# Patient Record
Sex: Male | Born: 1980
Health system: Southern US, Community
[De-identification: ages and names within clinical notes are randomized; demographics above are authoritative.]

## PROBLEM LIST (undated history)

## (undated) DIAGNOSIS — G43909 Migraine, unspecified, not intractable, without status migrainosus: Secondary | ICD-10-CM

## (undated) DIAGNOSIS — I10 Essential (primary) hypertension: Secondary | ICD-10-CM

## (undated) HISTORY — PX: VASECTOMY: SHX75

## (undated) HISTORY — DX: Migraine, unspecified, not intractable, without status migrainosus: G43.909

## (undated) HISTORY — PX: TONSILLECTOMY: SUR1361

---

## 2004-09-29 ENCOUNTER — Inpatient Hospital Stay (HOSPITAL_COMMUNITY): Admission: EM | Admit: 2004-09-29 | Discharge: 2004-10-06 | Payer: Self-pay | Admitting: Emergency Medicine

## 2004-11-10 ENCOUNTER — Ambulatory Visit (HOSPITAL_COMMUNITY): Admission: RE | Admit: 2004-11-10 | Discharge: 2004-11-10 | Payer: Self-pay | Admitting: General Surgery
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
## 2018-06-10 ENCOUNTER — Encounter: Payer: Self-pay | Admitting: Family Medicine

## 2018-06-10 ENCOUNTER — Ambulatory Visit (INDEPENDENT_AMBULATORY_CARE_PROVIDER_SITE_OTHER): Payer: BLUE CROSS/BLUE SHIELD | Admitting: Family Medicine

## 2018-06-10 VITALS — BP 128/80 | HR 75 | Ht 76.0 in | Wt 275.0 lb

## 2018-06-10 DIAGNOSIS — Z0000 Encounter for general adult medical examination without abnormal findings: Secondary | ICD-10-CM | POA: Diagnosis not present

## 2018-06-10 DIAGNOSIS — Z23 Encounter for immunization: Secondary | ICD-10-CM

## 2018-06-10 LAB — CBC
HCT: 42.7 % (ref 39.0–52.0)
Hemoglobin: 14.1 g/dL (ref 13.0–17.0)
MCHC: 32.9 g/dL (ref 30.0–36.0)
MCV: 84.2 fl (ref 78.0–100.0)
Platelets: 277 10*3/uL (ref 150.0–400.0)
RBC: 5.07 Mil/uL (ref 4.22–5.81)
RDW: 13.2 % (ref 11.5–15.5)
WBC: 6.5 10*3/uL (ref 4.0–10.5)

## 2018-06-10 LAB — COMPREHENSIVE METABOLIC PANEL
ALBUMIN: 4.4 g/dL (ref 3.5–5.2)
ALT: 33 U/L (ref 0–53)
AST: 23 U/L (ref 0–37)
Alkaline Phosphatase: 51 U/L (ref 39–117)
BUN: 10 mg/dL (ref 6–23)
CHLORIDE: 98 meq/L (ref 96–112)
CO2: 26 meq/L (ref 19–32)
Calcium: 9.6 mg/dL (ref 8.4–10.5)
Creatinine, Ser: 1.09 mg/dL (ref 0.40–1.50)
GFR: 96.9 mL/min (ref >60.00)
GLUCOSE: 84 mg/dL (ref 70–99)
Potassium: 3.6 mEq/L (ref 3.5–5.1)
SODIUM: 134 meq/L — AB (ref 135–145)
Total Bilirubin: 0.4 mg/dL (ref 0.2–1.2)
Total Protein: 8.4 g/dL — ABNORMAL HIGH (ref 6.0–8.3)

## 2018-06-10 LAB — LIPID PANEL
CHOL/HDL RATIO: 5
Cholesterol: 165 mg/dL (ref 0–200)
HDL: 34.4 mg/dL — ABNORMAL LOW (ref >39.00)
LDL CALC: 113 mg/dL — AB (ref 0–99)
NONHDL: 130.85
Triglycerides: 91 mg/dL (ref 0.0–149.0)
VLDL: 18.2 mg/dL (ref 0.0–40.0)

## 2018-06-10 LAB — URINALYSIS, ROUTINE W REFLEX MICROSCOPIC
Bilirubin Urine: NEGATIVE
Hgb urine dipstick: NEGATIVE
KETONES UR: 40 — AB
Leukocytes, UA: NEGATIVE
Nitrite: NEGATIVE
Specific Gravity, Urine: 1.01 (ref 1.000–1.030)
TOTAL PROTEIN, URINE-UPE24: NEGATIVE
URINE GLUCOSE: NEGATIVE
Urobilinogen, UA: 0.2 (ref 0.0–1.0)
pH: 6 (ref 5.0–8.0)

## 2018-06-10 NOTE — Progress Notes (Signed)
Subjective:  Patient ID: Eric Roberson, male    DOB: 1980/08/27  Age: 37 y.o. MRN: [id number]  CC: Establish Care and Annual Exam   HPI Eric Roberson presents for a physical exam.  He is fasting.  He lives with his wife and 6310 and 39-year-old children.  He enjoys good health as far as he knows.  He has lost some 30 pounds on the keto diet.  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  He does not smoke, use illicit drugs and rarely drinks alcohol.  He does not have a regular exercise program.  His father is 2174 and is somewhat overweight but is in good health otherwise.  His mother is 1471 he is overweight and has hypertension and osteoarthritis.  There is a family history of various cancers.  History Eric Roberson has no past medical history on file.   He has no past surgical history on file.   His family history is not on file.He reports that he has never smoked. He has never used smokeless tobacco. He reports that he drinks alcohol. He reports that he does not use drugs.  No outpatient medications prior to visit.   No facility-administered medications prior to visit.     ROS Review of Systems  Constitutional: Negative.   HENT: Negative.   Eyes: Negative for photophobia and visual disturbance.  Respiratory: Negative.   Cardiovascular: Negative.   Gastrointestinal: Negative.   Endocrine: Negative for polyphagia and polyuria.  Genitourinary: Negative for difficulty urinating, frequency and urgency.  Musculoskeletal: Negative for gait problem and joint swelling.  Skin: Negative for pallor and rash.  Allergic/Immunologic: Negative for immunocompromised state.  Neurological: Negative for light-headedness and headaches.  Hematological: Does not bruise/bleed easily.  Psychiatric/Behavioral: Negative.     Objective:  BP 128/80 (BP Location: Right Arm, Patient Position: Sitting, Cuff Size: Normal)   Pulse 75   Ht 6\' 4"  (1.93 m)   Wt 275 lb (124.7 kg)   SpO2 99%   BMI 33.47 kg/m   Physical Exam    Constitutional: He is oriented to person, place, and time. He appears well-developed and well-nourished. No distress.  HENT:  Head: Normocephalic and atraumatic.  Right Ear: External ear normal.  Left Ear: External ear normal.  Mouth/Throat: Oropharynx is clear and moist. No oropharyngeal exudate.  Eyes: Pupils are equal, round, and reactive to light. Conjunctivae and EOM are normal. Right eye exhibits no discharge. Left eye exhibits no discharge. No scleral icterus.  Neck: Neck supple. No JVD present. No tracheal deviation present. No thyromegaly present.  Cardiovascular: Normal rate, regular rhythm and normal heart sounds.  Pulmonary/Chest: Effort normal and breath sounds normal.  Abdominal: Soft. He exhibits no distension. There is no tenderness. There is no guarding. Hernia confirmed negative in the right inguinal area and confirmed negative in the left inguinal area.  Genitourinary: Testes normal and penis normal. Right testis shows no mass, no swelling and no tenderness. Right testis is descended. Left testis shows no mass, no swelling and no tenderness. Left testis is descended. Circumcised. No hypospadias, penile erythema or penile tenderness. No discharge found.  Musculoskeletal: He exhibits no edema.  Lymphadenopathy:    He has no cervical adenopathy. No inguinal adenopathy noted on the right or left side.  Neurological: He is alert and oriented to person, place, and time.  Skin: Skin is warm and dry. He is not diaphoretic.  Psychiatric: He has a normal mood and affect. His behavior is normal.      Assessment & Plan:  Eric Roberson was seen today for establish care and annual exam.  Diagnoses and all orders for this visit:  Healthcare maintenance -     CBC -     Comprehensive metabolic panel -     Lipid panel -     Urinalysis, Routine w reflex microscopic   Eric Roberson does not currently have medications on file.  No orders of the defined types were placed in this  encounter.  Patient is headed to the [place] on Friday.  He will receive his flu vaccine tetanus today.  Made him aware that the keto diet could skew his lipid profile.  Suggested follow-up will pend the results of his blood work today.  Patient was given information on health maintenance and disease prevention including exercise to lose weight.  Follow-up: Return in about 6 months (around 12/09/2018).  Mliss Sax, MD

## 2018-06-10 NOTE — Patient Instructions (Signed)
Health Maintenance, Male A healthy lifestyle and preventive care is important for your health and wellness. Ask your health care provider about what schedule of regular examinations is right for you. What should I know about weight and diet? Eat a Healthy Diet  Eat plenty of vegetables, fruits, whole grains, low-fat dairy products, and lean protein.  Do not eat a lot of foods high in solid fats, added sugars, or salt.  Maintain a Healthy Weight Regular exercise can help you achieve or maintain a healthy weight. You should:  Do at least 150 minutes of exercise each week. The exercise should increase your heart rate and make you sweat (moderate-intensity exercise).  Do strength-training exercises at least twice a week.  Watch Your Levels of Cholesterol and Blood Lipids  Have your blood tested for lipids and cholesterol every 5 years starting at 39 years of age. If you are at high risk for heart disease, you should start having your blood tested when you are 39 years old. You may need to have your cholesterol levels checked more often if: ? Your lipid or cholesterol levels are high. ? You are older than 39 years of age. ? You are at high risk for heart disease.  What should I know about cancer screening? Many types of cancers can be detected early and may often be prevented. Lung Cancer  You should be screened every year for lung cancer if: ? You are a current smoker who has smoked for at least 30 years. ? You are a former smoker who has quit within the past 15 years.  Talk to your health care provider about your screening options, when you should start screening, and how often you should be screened.  Colorectal Cancer  Routine colorectal cancer screening usually begins at 39 years of age and should be repeated every 5-10 years until you are 39 years old. You may need to be screened more often if early forms of precancerous polyps or small growths are found. Your health care provider  may recommend screening at an earlier age if you have risk factors for colon cancer.  Your health care provider may recommend using home test kits to check for hidden blood in the stool.  A small camera at the end of a tube can be used to examine your colon (sigmoidoscopy or colonoscopy). This checks for the earliest forms of colorectal cancer.  Prostate and Testicular Cancer  Depending on your age and overall health, your health care provider may do certain tests to screen for prostate and testicular cancer.  Talk to your health care provider about any symptoms or concerns you have about testicular or prostate cancer.  Skin Cancer  Check your skin from head to toe regularly.  Tell your health care provider about any new moles or changes in moles, especially if: ? There is a change in a mole's size, shape, or color. ? You have a mole that is larger than a pencil eraser.  Always use sunscreen. Apply sunscreen liberally and repeat throughout the day.  Protect yourself by wearing long sleeves, pants, a wide-brimmed hat, and sunglasses when outside.  What should I know about heart disease, diabetes, and high blood pressure?  If you are 18-39 years of age, have your blood pressure checked every 3-5 years. If you are 40 years of age or older, have your blood pressure checked every year. You should have your blood pressure measured twice-once when you are at a hospital or clinic, and once when   you are not at a hospital or clinic. Record the average of the two measurements. To check your blood pressure when you are not at a hospital or clinic, you can use: ? An automated blood pressure machine at a pharmacy. ? A home blood pressure monitor.  Talk to your health care provider about your target blood pressure.  If you are between 45-79 years old, ask your health care provider if you should take aspirin to prevent heart disease.  Have regular diabetes screenings by checking your fasting blood  sugar level. ? If you are at a normal weight and have a low risk for diabetes, have this test once every three years after the age of 45. ? If you are overweight and have a high risk for diabetes, consider being tested at a younger age or more often.  A one-time screening for abdominal aortic aneurysm (AAA) by ultrasound is recommended for men aged 65-75 years who are current or former smokers. What should I know about preventing infection? Hepatitis B If you have a higher risk for hepatitis B, you should be screened for this virus. Talk with your health care provider to find out if you are at risk for hepatitis B infection. Hepatitis C Blood testing is recommended for:  Everyone born from 1945 through 1965.  Anyone with known risk factors for hepatitis C.  Sexually Transmitted Diseases (STDs)  You should be screened each year for STDs including gonorrhea and chlamydia if: ? You are sexually active and are younger than 39 years of age. ? You are older than 39 years of age and your health care provider tells you that you are at risk for this type of infection. ? Your sexual activity has changed since you were last screened and you are at an increased risk for chlamydia or gonorrhea. Ask your health care provider if you are at risk.  Talk with your health care provider about whether you are at high risk of being infected with HIV. Your health care provider may recommend a prescription medicine to help prevent HIV infection.  What else can I do?  Schedule regular health, dental, and eye exams.  Stay current with your vaccines (immunizations).  Do not use any tobacco products, such as cigarettes, chewing tobacco, and e-cigarettes. If you need help quitting, ask your health care provider.  Limit alcohol intake to no more than 2 drinks per day. One drink equals 12 ounces of beer, 5 ounces of wine, or 1 ounces of hard liquor.  Do not use street drugs.  Do not share needles.  Ask your  health care provider for help if you need support or information about quitting drugs.  Tell your health care provider if you often feel depressed.  Tell your health care provider if you have ever been abused or do not feel safe at home. This information is not intended to replace advice given to you by your health care provider. Make sure you discuss any questions you have with your health care provider. Document Released: 01/06/2008 Document Revised: 03/08/2016 Document Reviewed: 04/13/2015 Elsevier Interactive Patient Education  2018 Else[employer] Inc.  Exercising to Lose Weight Exercising can help you to lose weight. In order to lose weight through exercise, you need to do vigorous-intensity exercise. You can tell that you are exercising with vigorous intensity if you are breathing very hard and fast and cannot hold a conversation while exercising. Moderate-intensity exercise helps to maintain your current weight. You can tell that you are exercising   at a moderate level if you have a higher heart rate and faster breathing, but you are still able to hold a conversation. How often should I exercise? Choose an activity that you enjoy and set realistic goals. Your health care provider can help you to make an activity plan that works for you. Exercise regularly as directed by your health care provider. This may include:  Doing resistance training twice each week, such as: ? Push-ups. ? Sit-ups. ? Lifting weights. ? Using resistance bands.  Doing a given intensity of exercise for a given amount of time. Choose from these options: ? 150 minutes of moderate-intensity exercise every week. ? 75 minutes of vigorous-intensity exercise every week. ? A mix of moderate-intensity and vigorous-intensity exercise every week.  Children, pregnant women, people who are out of shape, people who are overweight, and older adults may need to consult a health care provider for individual recommendations. If you have  any sort of medical condition, be sure to consult your health care provider before starting a new exercise program. What are some activities that can help me to lose weight?  Walking at a rate of at least 4.5 miles an hour.  Jogging or running at a rate of 5 miles per hour.  Biking at a rate of at least 10 miles per hour.  Lap swimming.  Roller-skating or in-line skating.  Cross-country skiing.  Vigorous competitive sports, such as football, basketball, and soccer.  Jumping rope.  Aerobic dancing. How can I be more active in my day-to-day activities?  Use the stairs instead of the elevator.  Take a walk during your lunch break.  If you drive, park your car farther away from work or school.  If you take public transportation, get off one stop early and walk the rest of the way.  Make all of your phone calls while standing up and walking around.  Get up, stretch, and walk around every 30 minutes throughout the day. What guidelines should I follow while exercising?  Do not exercise so much that you hurt yourself, feel dizzy, or get very short of breath.  Consult your health care provider prior to starting a new exercise program.  Wear comfortable clothes and shoes with good support.  Drink plenty of water while you exercise to prevent dehydration or heat stroke. Body water is lost during exercise and must be replaced.  Work out until you breathe faster and your heart beats faster. This information is not intended to replace advice given to you by your health care provider. Make sure you discuss any questions you have with your health care provider. Document Released: 08/12/2010 Document Revised: 12/16/2015 Document Reviewed: 12/11/2013 Elsevier Interactive Patient Education  2018 Elsevier Inc.  

## 2018-06-10 NOTE — Addendum Note (Signed)
Addended by: Marcell AngerSELF, Kanyia Heaslip E on: 06/10/2018 03:36 PM   Modules accepted: Orders

## 2018-10-22 ENCOUNTER — Encounter: Payer: Self-pay | Admitting: Family Medicine

## 2018-10-22 ENCOUNTER — Ambulatory Visit (INDEPENDENT_AMBULATORY_CARE_PROVIDER_SITE_OTHER): Payer: BLUE CROSS/BLUE SHIELD | Admitting: Family Medicine

## 2018-10-22 VITALS — Ht 76.0 in

## 2018-10-22 DIAGNOSIS — J301 Allergic rhinitis due to pollen: Secondary | ICD-10-CM | POA: Diagnosis not present

## 2018-10-22 DIAGNOSIS — H6982 Other specified disorders of Eustachian tube, left ear: Secondary | ICD-10-CM

## 2018-10-22 DIAGNOSIS — J019 Acute sinusitis, unspecified: Secondary | ICD-10-CM

## 2018-10-22 MED ORDER — FLUTICASONE PROPIONATE 50 MCG/ACT NA SUSP: 2.0000 | Freq: Every day | NASAL | 6 refills | Status: AC

## 2018-10-22 MED ORDER — CLARITHROMYCIN ER 500 MG PO TB24: 1000.0000 mg | ORAL_TABLET | Freq: Every day | ORAL | 0 refills | Status: AC

## 2018-10-22 NOTE — Progress Notes (Signed)
Established Patient Office Visit  Subjective:  Patient ID: Eric Roberson, male    DOB: 06/10/1981  Age: 38 y.o. MRN: [id number]  CC:  Chief Complaint  Patient presents with  . Sinus Problem    x 4 days, colored mucous when blowing nose, sinus pressure, ear pain mainly in the left, no fever. Have been having allergy problems x 2 weeks with runny nose, sneezing, coughing.    HPI Eric Roberson presents for 38-year-old recent onset of facial pressure teeth pain and yellow rhinorrhea.  There is been no fever or chills nausea or vomiting difficulty breathing.  He is also having discomfort and ear congestion on the left side.  This was preceded by a 2-week history of sneezing itchy watery eyes ears nose and throat with postnasal drip.  He was treating the symptoms with Benadryl.  He was taking aspirin as needed for pain and discomfort.  He has a history of springtime allergies.  Past Medical History:  Diagnosis Date  . Migraine     Past Surgical History:  Procedure Laterality Date  . TONSILLECTOMY    . VASECTOMY      Family History  Problem Relation Age of Onset  . Arthritis Mother   . Hypertension Mother     Social History   Socioeconomic History  . Marital status: Single    Spouse name: Not on file  . Number of children: Not on file  . Years of education: Not on file  . Highest education level: Not on file  Occupational History  . Not on file  Social Needs  . Financial resource strain: Not on file  . Food insecurity:    Worry: Not on file    Inability: Not on file  . Transportation needs:    Medical: Not on file    Non-medical: Not on file  Tobacco Use  . Smoking status: Never Smoker  . Smokeless tobacco: Never Used  Substance and Sexual Activity  . Alcohol use: Yes    Comment: rarely  . Drug use: Never  . Sexual activity: Not on file  Lifestyle  . Physical activity:    Days per week: Not on file    Minutes per session: Not on file  . Stress: Not on  file  Relationships  . Social connections:    Talks on phone: Not on file    Gets together: Not on file    Attends religious service: Not on file    Active member of club or organization: Not on file    Attends meetings of clubs or organizations: Not on file    Relationship status: Not on file  . Intimate partner violence:    Fear of current or ex partner: Not on file    Emotionally abused: Not on file    Physically abused: Not on file    Forced sexual activity: Not on file  Other Topics Concern  . Not on file  Social History Narrative  . Not on file    No outpatient medications prior to visit.   No facility-administered medications prior to visit.     No Known Allergies  ROS Review of Systems  Constitutional: Negative for diaphoresis, fatigue, fever and unexpected weight change.  HENT: Positive for congestion, ear pain, hearing loss, postnasal drip, rhinorrhea, sinus pressure, sinus pain and sneezing. Negative for ear discharge.   Eyes: Negative for photophobia and visual disturbance.  Respiratory: Negative.   Cardiovascular: Negative.   Gastrointestinal: Negative.   Musculoskeletal: Negative for  arthralgias and myalgias.  Neurological: Negative for headaches.  Hematological: Negative.   Psychiatric/Behavioral: Negative.       Objective:    Physical Exam  Constitutional: He is oriented to person, place, and time. He appears well-developed and well-nourished. No distress.  HENT:  Head: Normocephalic and atraumatic.  Right Ear: External ear normal.  Left Ear: External ear normal.  Eyes: Right eye exhibits no discharge. Left eye exhibits no discharge. No scleral icterus.  Neck: No JVD present. No tracheal deviation present.  Pulmonary/Chest: Effort normal. No stridor.  Neurological: He is alert and oriented to person, place, and time.  Skin: He is not diaphoretic.  Psychiatric: He has a normal mood and affect. His behavior is normal.    Ht  (1.93 m)   BMI  33.47 kg/m  Wt Readings from Last 3 Encounters:  06/10/18 275 lb (124.7 kg)     There are no preventive care reminders to display for this patient.  There are no preventive care reminders to display for this patient.  No results found for: TSH Lab Results  Component Value Date   WBC 6.5 06/10/2018   HGB 14.1 06/10/2018   HCT 42.7 06/10/2018   MCV 84.2 06/10/2018   PLT 277.0 06/10/2018   Lab Results  Component Value Date   NA 134 (L) 06/10/2018   K 3.6 06/10/2018   CO2 26 06/10/2018   GLUCOSE 84 06/10/2018   BUN 10 06/10/2018   CREATININE 1.09 06/10/2018   BILITOT 0.4 06/10/2018   ALKPHOS 51 06/10/2018   AST 23 06/10/2018   ALT 33 06/10/2018   PROT 8.4 (H) 06/10/2018   ALBUMIN 4.4 06/10/2018   CALCIUM 9.6 06/10/2018   GFR 96.90 06/10/2018   Lab Results  Component Value Date   CHOL 165 06/10/2018   Lab Results  Component Value Date   HDL 34.40 (L) 06/10/2018   Lab Results  Component Value Date   LDLCALC 113 (H) 06/10/2018   Lab Results  Component Value Date   TRIG 91.0 06/10/2018   Lab Results  Component Value Date   CHOLHDL 5 06/10/2018   No results found for: HGBA1C    Assessment & Plan:   Problem List Items Addressed This Visit      Respiratory   Acute sinusitis - Primary   Relevant Medications   clarithromycin (BIAXIN XL) 500 MG 24 hr tablet   fluticasone (FLONASE) 50 MCG/ACT nasal spray   Seasonal allergic rhinitis due to pollen   Relevant Medications   fluticasone (FLONASE) 50 MCG/ACT nasal spray     Nervous and Auditory   ETD (Eustachian tube dysfunction), left   Relevant Medications   clarithromycin (BIAXIN XL) 500 MG 24 hr tablet   fluticasone (FLONASE) 50 MCG/ACT nasal spray      Meds ordered this encounter  Medications  . clarithromycin (BIAXIN XL) 500 MG 24 hr tablet    Sig: Take 2 tablets (1,000 mg total) by mouth daily for 10 days.    Dispense:  20 tablet    Refill:  0  . fluticasone (FLONASE) 50 MCG/ACT nasal spray     Sig: Place 2 sprays into both nostrils daily.    Dispense:  16 g    Refill:  6    Follow-up: Return in about 10 days (around 11/01/2018), or if symptoms worsen or fail to improve.    Mliss Sax, MDVirtual Visit via Video Note  I connected with Eric Roberson on 10/22/18 at  9:30 AM EDT by  a video enabled telemedicine application and verified that I am speaking with the correct person using two identifiers.   I discussed the limitations of evaluation and management by telemedicine and the availability of in person appointments. The patient expressed understanding and agreed to proceed.  History of Present Illness:    Observations/Objective:   Assessment and Plan:   Follow Up Instructions:    I discussed the assessment and treatment plan with the patient. The patient was provided an opportunity to ask questions and all were answered. The patient agreed with the plan and demonstrated an understanding of the instructions.   The patient was advised to call back or seek an in-person evaluation if the symptoms worsen or if the condition fails to improve as anticipated.  I provided 20 minutes of non-face-to-face time during this encounter.   Patient will use the Biaxin to treat his sinus infection and also take advantage of its anti-inflammatory properties to help with the infection and his apparent eustachian tube dysfunction.  Encouraged him to use the Flonase for at least 10 days to 2 weeks to receive its full benefit.  Follow-up with me in 10 days if not improving.

## 2019-09-18 ENCOUNTER — Other Ambulatory Visit: Payer: Self-pay

## 2019-09-18 ENCOUNTER — Ambulatory Visit: Payer: BC Managed Care – PPO | Attending: Internal Medicine

## 2019-09-18 DIAGNOSIS — Z23 Encounter for immunization: Secondary | ICD-10-CM | POA: Insufficient documentation

## 2019-09-18 NOTE — Progress Notes (Signed)
   Covid-19 Vaccination Clinic  Name:  Momen Ham    MRN: [id number] DOB: [birth year]  09/18/2019  Mr. Stavola was observed post Covid-19 immunization for 15 minutes without incidence. He was provided with Vaccine Information Sheet and instruction to access the V-Safe system.   Mr. Urbas was instructed to call 911 with any severe reactions post vaccine: Marland Kitchen Difficulty breathing  . Swelling of your face and throat  . A fast heartbeat  . A bad rash all over your body  . Dizziness and weakness    Immunizations Administered    Name Date Dose VIS Date Route   Moderna COVID-19 Vaccine 09/18/2019 11:10 AM 0.5 mL 06/24/2019 Intramuscular   Manufacturer: Moderna   Lot: [id number]   NDC: 53646-803-21

## 2019-10-21 ENCOUNTER — Ambulatory Visit: Payer: BC Managed Care – PPO | Attending: Family

## 2019-10-21 DIAGNOSIS — Z23 Encounter for immunization: Secondary | ICD-10-CM

## 2019-10-21 NOTE — Progress Notes (Signed)
   Covid-19 Vaccination Clinic  Name:  Eric Roberson    MRN: [id number] DOB: 10/11/1980  10/21/2019  [name] was observed post Covid-19 immunization for 15 minutes without incident. He was provided with Vaccine Information Sheet and instruction to access the V-Safe system.   [name] was instructed to call 911 with any severe reactions post vaccine: Marland Kitchen Difficulty breathing  . Swelling of face and throat  . A fast heartbeat  . A bad rash all over body  . Dizziness and weakness   Immunizations Administered    Name Date Dose VIS Date Route   Moderna COVID-19 Vaccine 10/21/2019 11:18 AM 0.5 mL 06/24/2019 Intramuscular   Manufacturer: Moderna   Lot: [id number]   NDC: 86773-736-68

## 2020-04-21 ENCOUNTER — Other Ambulatory Visit: Payer: Self-pay | Admitting: Family Medicine

## 2020-04-21 DIAGNOSIS — J301 Allergic rhinitis due to pollen: Secondary | ICD-10-CM

## 2020-04-21 DIAGNOSIS — H6982 Other specified disorders of Eustachian tube, left ear: Secondary | ICD-10-CM

## 2020-07-16 DIAGNOSIS — Z03818 Encounter for observation for suspected exposure to other biological agents ruled out: Secondary | ICD-10-CM | POA: Diagnosis not present

## 2024-06-10 ENCOUNTER — Emergency Department (HOSPITAL_BASED_OUTPATIENT_CLINIC_OR_DEPARTMENT_OTHER)

## 2024-06-10 ENCOUNTER — Encounter (HOSPITAL_BASED_OUTPATIENT_CLINIC_OR_DEPARTMENT_OTHER): Payer: Self-pay

## 2024-06-10 ENCOUNTER — Other Ambulatory Visit: Payer: Self-pay

## 2024-06-10 ENCOUNTER — Emergency Department (HOSPITAL_BASED_OUTPATIENT_CLINIC_OR_DEPARTMENT_OTHER)
Admission: EM | Admit: 2024-06-10 | Discharge: 2024-06-10 | Disposition: A | Discharge status: Home / Self Care | Attending: Emergency Medicine | Admitting: Emergency Medicine

## 2024-06-10 DIAGNOSIS — R600 Localized edema: Secondary | ICD-10-CM | POA: Insufficient documentation

## 2024-06-10 DIAGNOSIS — R7989 Other specified abnormal findings of blood chemistry: Secondary | ICD-10-CM | POA: Diagnosis not present

## 2024-06-10 DIAGNOSIS — R233 Spontaneous ecchymoses: Secondary | ICD-10-CM | POA: Diagnosis not present

## 2024-06-10 HISTORY — DX: Essential (primary) hypertension: I10

## 2024-06-10 LAB — BASIC METABOLIC PANEL WITH GFR
Anion gap: 10 (ref 5–15)
BUN: 14 mg/dL (ref 6–20)
CO2: 26 mmol/L (ref 22–32)
Calcium: 9.3 mg/dL (ref 8.9–10.3)
Chloride: 102 mmol/L (ref 98–111)
Creatinine, Ser: 1.32 mg/dL — ABNORMAL HIGH (ref 0.61–1.24)
GFR, Estimated: >60 mL/min (ref >60)
Glucose, Bld: 96 mg/dL (ref 70–99)
Potassium: 3.9 mmol/L (ref 3.5–5.1)
Sodium: 137 mmol/L (ref 135–145)

## 2024-06-10 LAB — CBC WITH DIFFERENTIAL/PLATELET
Abs Immature Granulocytes: 0.04 K/uL (ref 0.00–0.07)
Basophils Absolute: 0.1 K/uL (ref 0.0–0.1)
Basophils Relative: 1 %
Eosinophils Absolute: 0.1 K/uL (ref 0.0–0.5)
Eosinophils Relative: 1 %
HCT: 42 % (ref 39.0–52.0)
Hemoglobin: 14 g/dL (ref 13.0–17.0)
Immature Granulocytes: 1 %
Lymphocytes Relative: 28 %
Lymphs Abs: 1.7 K/uL (ref 0.7–4.0)
MCH: 28 pg (ref 26.0–34.0)
MCHC: 33.3 g/dL (ref 30.0–36.0)
MCV: 84 fL (ref 80.0–100.0)
Monocytes Absolute: 0.6 K/uL (ref 0.1–1.0)
Monocytes Relative: 10 %
Neutro Abs: 3.6 K/uL (ref 1.7–7.7)
Neutrophils Relative %: 59 %
Platelets: 294 K/uL (ref 150–400)
RBC: 5 MIL/uL (ref 4.22–5.81)
RDW: 13.1 % (ref 11.5–15.5)
WBC: 6 K/uL (ref 4.0–10.5)
nRBC: 0 % (ref 0.0–0.2)

## 2024-06-10 LAB — PROTIME-INR
INR: 1 (ref 0.8–1.2)
Prothrombin Time: 14 s (ref 11.4–15.2)

## 2024-06-10 NOTE — Discharge Instructions (Signed)
 Today you were seen for leg swelling.  I suspect this is likely due to something called peripheral edema.  Please wear compression stockings as needed and elevate your extremities when possible.  Thank you for letting us  treat you today. After reviewing your labs and imaging, I feel you are safe to go home. Please follow up with your PCP in the next several days and provide them with your records from this visit. Return to the Emergency Room if pain becomes severe or symptoms worsen.

## 2024-06-10 NOTE — ED Provider Notes (Signed)
 [facility] Provider Note   CSN: [id number] Arrival date & time: 06/10/24  1518     Patient presents with: Leg Swelling   Eric Roberson is a 43 y.o. male presents today from urgent care for concern for DVT.  Patient has bilateral lower extremity edema and petechiae since yesterday.  Patient reports flying from [facility] yesterday.  Patient denies chest pain, shortness of breath, nausea, vomiting, numbness, weakness, complaints at this time.  Patient denies any history of blood clot.   HPI     Prior to Admission medications   Medication Sig Start Date End Date Taking? Authorizing Provider  fluticasone  (FLONASE ) 50 MCG/ACT nasal spray Place 2 sprays into both nostrils daily. 10/22/18   Berneta Elsie Sayre, MD    Allergies: Patient has no known allergies.    Review of Systems  Cardiovascular:  Positive for leg swelling.    Updated Vital Signs BP (!) 138/98 (BP Location: Right Arm)   Pulse 86   Temp 97.7 F (36.5 C)   Resp 18   SpO2 98%   Physical Exam Vitals and nursing note reviewed.  Constitutional:      General: He is not in acute distress.    Appearance: He is well-developed. He is not toxic-appearing.  HENT:     Head: Normocephalic and atraumatic.  Eyes:     Conjunctiva/sclera: Conjunctivae normal.  Cardiovascular:     Rate and Rhythm: Normal rate and regular rhythm.     Pulses: Normal pulses.     Heart sounds: Normal heart sounds. No murmur heard. Pulmonary:     Effort: Pulmonary effort is normal. No respiratory distress.     Breath sounds: Normal breath sounds.  Abdominal:     Palpations: Abdomen is soft.     Tenderness: There is no abdominal tenderness.  Musculoskeletal:        General: No swelling.     Cervical back: Neck supple.     Right lower leg: Edema present.     Left lower leg: Edema present.     Comments: +1 pitting edema of bilateral feet, patient is neurovascularly intact with 2 dorsalis pedis  pulses bilaterally.  Skin:    General: Skin is warm and dry.     Capillary Refill: Capillary refill takes less than 2 seconds.  Neurological:     General: No focal deficit present.     Mental Status: He is alert and oriented to person, place, and time.  Psychiatric:        Mood and Affect: Mood normal.     (all labs ordered are listed, but only abnormal results are displayed) Labs Reviewed  BASIC METABOLIC PANEL WITH GFR - Abnormal; Notable for the following components:      Result Value   Creatinine, Ser 1.32 (*)    All other components within normal limits  CBC WITH DIFFERENTIAL/PLATELET  PROTIME-INR    EKG: None  Radiology: US  Venous Img Lower Bilateral (DVT) Result Date: 06/10/2024 CLINICAL DATA:  Bilateral lower extremity edema. EXAM: BILATERAL LOWER EXTREMITY VENOUS DOPPLER ULTRASOUND TECHNIQUE: Gray-scale sonography with graded compression, as well as color Doppler and duplex ultrasound were performed to evaluate the lower extremity deep venous systems from the level of the common femoral vein and including the common femoral, femoral, profunda femoral, popliteal and calf veins including the posterior tibial, peroneal and gastrocnemius veins when visible. The superficial great saphenous vein was also interrogated. Spectral Doppler was utilized to evaluate flow at rest and  with distal augmentation maneuvers in the common femoral, femoral and popliteal veins. COMPARISON:  None Available. FINDINGS: RIGHT LOWER EXTREMITY Common Femoral Vein: No evidence of thrombus. Normal compressibility, respiratory phasicity and response to augmentation. Saphenofemoral Junction: No evidence of thrombus. Normal compressibility and flow on color Doppler imaging. Profunda Femoral Vein: No evidence of thrombus. Normal compressibility and flow on color Doppler imaging. Femoral Vein: No evidence of thrombus. Normal compressibility, respiratory phasicity and response to augmentation. Popliteal Vein: No  evidence of thrombus. Normal compressibility, respiratory phasicity and response to augmentation. Calf Veins: No evidence of thrombus. Normal compressibility and flow on color Doppler imaging. Superficial Great Saphenous Vein: No evidence of thrombus. Normal compressibility. Venous Reflux:  None. Other Findings: No evidence of superficial thrombophlebitis or abnormal fluid collection. LEFT LOWER EXTREMITY Common Femoral Vein: No evidence of thrombus. Normal compressibility, respiratory phasicity and response to augmentation. Saphenofemoral Junction: No evidence of thrombus. Normal compressibility and flow on color Doppler imaging. Profunda Femoral Vein: No evidence of thrombus. Normal compressibility and flow on color Doppler imaging. Femoral Vein: No evidence of thrombus. Normal compressibility, respiratory phasicity and response to augmentation. Popliteal Vein: No evidence of thrombus. Normal compressibility, respiratory phasicity and response to augmentation. Calf Veins: No evidence of thrombus. Normal compressibility and flow on color Doppler imaging. Superficial Great Saphenous Vein: No evidence of thrombus. Normal compressibility. Venous Reflux:  None. Other Findings: No evidence of superficial thrombophlebitis or abnormal fluid collection. IMPRESSION: No evidence of deep venous thrombosis in either lower extremity. Electronically Signed   By: Marcey Moan M.D.   On: 06/10/2024 17:01     Procedures   Medications Ordered in the ED - No data to display                                  Medical Decision Making Amount and/or Complexity of Data Reviewed Labs: ordered.   This patient presents to the ED for concern of Bilateral leg swelling with petechiae differential diagnosis includes DVT, dependent edema, clotting disorder    Additional history obtained   Additional history obtained from Electronic Medical Record External records from outside source obtained and reviewed including Care  Everywhere   Lab Tests:  I Ordered, and personally interpreted labs.  The pertinent results include: Mildly elevated creatinine at 1.32 from a baseline of approximately 1.1, pro time and INR WNL, CBC unremarkable   Imaging Studies ordered:  I ordered imaging studies including bilateral lower extremity DVT ultrasound I independently visualized and interpreted imaging which showed No evidence of DVT in either lower extremity. I agree with the radiologist interpretation  Problem List / ED Course:  Considered for admission or further workup however patient's vital signs, physical exam, labs, and imaging are reassuring.  Patient symptoms likely due to peripheral edema.  Patient advised to wear compression stockings and elevate lower extremities.  Patient given return precautions.  I feel patient is safe for discharge at this time.       Final diagnoses:  Peripheral edema    ED Discharge Orders     None          Francis Ileana LOISE DEVONNA 06/10/24 1706    Lenor Hollering, MD 06/10/24 1712

## 2024-06-10 NOTE — ED Triage Notes (Signed)
 Pt reports bilateral leg edema  & petechiae yesterday. Reports return on a flight from [facility] yesterday. Denies cp/SOB
